# Patient Record
Sex: Female | Born: 1970
Health system: Southern US, Community
[De-identification: ages and names within clinical notes are randomized; demographics above are authoritative.]

## PROBLEM LIST (undated history)

## (undated) DIAGNOSIS — T7840XA Allergy, unspecified, initial encounter: Secondary | ICD-10-CM

## (undated) DIAGNOSIS — N200 Calculus of kidney: Secondary | ICD-10-CM

## (undated) DIAGNOSIS — O009 Unspecified ectopic pregnancy without intrauterine pregnancy: Secondary | ICD-10-CM

## (undated) DIAGNOSIS — Z87442 Personal history of urinary calculi: Secondary | ICD-10-CM

## (undated) DIAGNOSIS — R011 Cardiac murmur, unspecified: Secondary | ICD-10-CM

## (undated) DIAGNOSIS — I10 Essential (primary) hypertension: Secondary | ICD-10-CM

## (undated) DIAGNOSIS — D649 Anemia, unspecified: Secondary | ICD-10-CM

## (undated) HISTORY — PX: ECTOPIC PREGNANCY SURGERY: SHX613

## (undated) HISTORY — PX: WRIST SURGERY: SHX841

## (undated) HISTORY — DX: Cardiac murmur, unspecified: R01.1

## (undated) HISTORY — DX: Essential (primary) hypertension: I10

## (undated) HISTORY — PX: LITHOTRIPSY: SUR834

## (undated) HISTORY — PX: COLONOSCOPY: SHX174

## (undated) HISTORY — DX: Allergy, unspecified, initial encounter: T78.40XA

---

## 1998-05-26 ENCOUNTER — Other Ambulatory Visit: Admission: RE | Admit: 1998-05-26 | Discharge: 1998-05-26 | Payer: Self-pay | Admitting: Obstetrics and Gynecology

## 1999-05-22 ENCOUNTER — Other Ambulatory Visit: Admission: RE | Admit: 1999-05-22 | Discharge: 1999-05-22 | Payer: Self-pay | Admitting: Obstetrics and Gynecology

## 2000-09-09 ENCOUNTER — Inpatient Hospital Stay (HOSPITAL_COMMUNITY): Admission: AD | Admit: 2000-09-09 | Discharge: 2000-09-11 | Payer: Self-pay | Admitting: *Deleted

## 2000-09-13 ENCOUNTER — Inpatient Hospital Stay (HOSPITAL_COMMUNITY): Admission: AD | Admit: 2000-09-13 | Discharge: 2000-09-18 | Payer: Self-pay | Admitting: Obstetrics & Gynecology

## 2000-10-11 ENCOUNTER — Other Ambulatory Visit: Admission: RE | Admit: 2000-10-11 | Discharge: 2000-10-11 | Payer: Self-pay | Admitting: Obstetrics & Gynecology

## 2002-07-02 ENCOUNTER — Other Ambulatory Visit: Admission: RE | Admit: 2002-07-02 | Discharge: 2002-07-02 | Payer: Self-pay | Admitting: Obstetrics and Gynecology

## 2002-12-17 DIAGNOSIS — O009 Unspecified ectopic pregnancy without intrauterine pregnancy: Secondary | ICD-10-CM

## 2002-12-17 HISTORY — DX: Unspecified ectopic pregnancy without intrauterine pregnancy: O00.90

## 2003-07-13 ENCOUNTER — Other Ambulatory Visit: Admission: RE | Admit: 2003-07-13 | Discharge: 2003-07-13 | Payer: Self-pay | Admitting: Obstetrics and Gynecology

## 2003-09-06 ENCOUNTER — Emergency Department (HOSPITAL_COMMUNITY): Admission: EM | Admit: 2003-09-06 | Discharge: 2003-09-07 | Payer: Self-pay | Admitting: Emergency Medicine

## 2003-09-07 ENCOUNTER — Encounter: Payer: Self-pay | Admitting: Emergency Medicine

## 2003-09-15 ENCOUNTER — Encounter: Payer: Self-pay | Admitting: Emergency Medicine

## 2003-09-16 ENCOUNTER — Ambulatory Visit (HOSPITAL_COMMUNITY): Admission: AD | Admit: 2003-09-16 | Discharge: 2003-09-16 | Payer: Self-pay | Admitting: Obstetrics & Gynecology

## 2004-07-13 ENCOUNTER — Other Ambulatory Visit: Admission: RE | Admit: 2004-07-13 | Discharge: 2004-07-13 | Payer: Self-pay | Admitting: Obstetrics and Gynecology

## 2005-12-10 ENCOUNTER — Inpatient Hospital Stay (HOSPITAL_COMMUNITY): Admission: AD | Admit: 2005-12-10 | Discharge: 2005-12-19 | Payer: Self-pay | Admitting: Obstetrics & Gynecology

## 2005-12-12 ENCOUNTER — Ambulatory Visit: Payer: Self-pay | Admitting: Internal Medicine

## 2005-12-14 ENCOUNTER — Encounter: Payer: Self-pay | Admitting: Urology

## 2005-12-18 ENCOUNTER — Ambulatory Visit: Payer: Self-pay | Admitting: Pulmonary Disease

## 2006-01-08 ENCOUNTER — Other Ambulatory Visit: Admission: RE | Admit: 2006-01-08 | Discharge: 2006-01-08 | Payer: Self-pay | Admitting: Obstetrics and Gynecology

## 2006-02-11 ENCOUNTER — Ambulatory Visit (HOSPITAL_COMMUNITY): Admission: RE | Admit: 2006-02-11 | Discharge: 2006-02-11 | Payer: Self-pay | Admitting: Urology

## 2011-06-21 ENCOUNTER — Ambulatory Visit (HOSPITAL_BASED_OUTPATIENT_CLINIC_OR_DEPARTMENT_OTHER)
Admission: RE | Admit: 2011-06-21 | Discharge: 2011-06-21 | Disposition: A | Discharge status: Home / Self Care | Payer: BC Managed Care – PPO | Source: Ambulatory Visit | Attending: Orthopedic Surgery | Admitting: Orthopedic Surgery

## 2011-06-21 DIAGNOSIS — M6740 Ganglion, unspecified site: Secondary | ICD-10-CM | POA: Insufficient documentation

## 2011-06-21 DIAGNOSIS — Z01812 Encounter for preprocedural laboratory examination: Secondary | ICD-10-CM | POA: Insufficient documentation

## 2011-06-21 LAB — POCT HEMOGLOBIN-HEMACUE: Hemoglobin: 12.4 g/dL (ref 12.0–15.0)

## 2011-06-29 NOTE — Op Note (Signed)
Beth Mejia, Beth Mejia               ACCOUNT NO.:  [id number]  MEDICAL RECORD NO.:  [id number]  LOCATION:                                 FACILITY:  PHYSICIAN:  Beth Fitch. Shaneal Barasch, M.D.      DATE OF BIRTH:  DATE OF PROCEDURE:  06/21/2011 DATE OF DISCHARGE:                              OPERATIVE REPORT   PREOPERATIVE DIAGNOSES:  Right volar ganglion cyst, 12 years status post prior resection of myxoid cyst from similar location with identification of myxoid cyst involving arterial branches of scaphoid and bifurcation of superficial and dorsal branches of radial artery.  POSTOPERATIVE DIAGNOSES:  Right volar ganglion cyst, 12 years status post prior resection of myxoid cyst from similar location with identification of myxoid cyst involving arterial branches of scaphoid and bifurcation of superficial and dorsal branches of radial artery.  OPERATION:  Resection of myxoid cyst, bifurcation of dorsal branch and superficial branch of right radial artery with involvement of branch to scaphoid.  OPERATING SURGEON:  Beth Fitch. Beth Ries, MD  ASSISTANT:  Beth Reeks Dasnoit, PA-C  ANESTHESIA:  General by LMA.  SUPERVISING ANESTHESIOLOGIST:  Beth Hora. 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 Twelve years prior, we had resected a cyst from a similar location.  She had a prolonged period of no recurrence.  Within the past 2 months, she developed a marble-sized firm mass at the bifurcation of the radial artery that was uncomfortable for her.  She requested this to be reexcised.  We explained her the natural history of cyst.  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  Under these circumstances, her goal was to have pain relief, therefore she requested to attempt a repeat resection.  Preoperatively, she was  advised of the other risks and benefits of surgery including anesthesia risks, risks of infection, and possible injury to the arterial structures.  Questions are invited and answered in detail.  PROCEDURE:  Beth Mejia was brought to room #2 of the [facility] and placed in supine position upon the operating table. Following an anesthesia consult with Dr. Gelene Mejia, general anesthesia by LMA technique was recommended and accepted.  Under Beth Mejia strict supervision, general anesthesia was induced with LMA technique followed by routine Betadine scrub and paint of the right upper extremity.  Following routine surgical time-out, the right arm was exsanguinated with Esmarch bandage and arterial tourniquet was inflated to 220 mmHg. Procedure commenced with resection of the prior transverse surgical scar.  Subcutaneous tissues were carefully divided taking care to identify the radial artery and its accompanying veins proximal to the site of the cyst.  With meticulous dissection, the cyst was dissected off the superficial and dorsal branch of the radial artery.  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  After circumferential dissection, the cyst was drained of its contents and the entire membrane removed.  The radial artery was then distended by direct pressure over  the proximal and distal segments of the artery. There was no sign of leakage.  The joint was carefully inspected.  No other sites of myxoid cyst formation was identified.  The volar radiocarpal ligaments were intact. Bleeding points were cauterized with bipolar current followed by repair of the skin with subcutaneous 4-0 Vicryl and intradermal 3-0 Prolene with Steri-Strips.  Compressive dressing was applied with a volar plaster splint maintaining the wrist in 10 degrees of dorsiflexion.  For  aftercare, Beth Mejia was provided prescription for Vicodin 5 mg 1 p.o. q.4-6 h. p.r.n. pain, 20 tablets without refill.  We will see her back for followup in a week for suture removal and discussion of postoperative rehab program.     Beth Fitch. Shedrick Sarli, M.D.     RVS/MEDQ  D:  06/21/2011  T:  06/22/2011  Job:  [id number]  cc:   Beth Mejia, M.D.  Electronically Signed by Beth Mejia M.D. on 06/29/2011 08:39:49 AM

## 2013-05-18 ENCOUNTER — Other Ambulatory Visit: Payer: Self-pay

## 2017-05-16 DIAGNOSIS — N200 Calculus of kidney: Secondary | ICD-10-CM | POA: Insufficient documentation

## 2017-05-16 DIAGNOSIS — R1031 Right lower quadrant pain: Secondary | ICD-10-CM | POA: Diagnosis not present

## 2017-05-16 DIAGNOSIS — N201 Calculus of ureter: Secondary | ICD-10-CM | POA: Insufficient documentation

## 2017-05-17 ENCOUNTER — Encounter (HOSPITAL_COMMUNITY): Payer: Self-pay

## 2017-05-17 ENCOUNTER — Other Ambulatory Visit: Payer: Self-pay | Admitting: Urology

## 2017-05-17 ENCOUNTER — Encounter (HOSPITAL_COMMUNITY): Payer: Self-pay | Admitting: *Deleted

## 2017-05-17 ENCOUNTER — Emergency Department (HOSPITAL_COMMUNITY): Payer: BLUE CROSS/BLUE SHIELD

## 2017-05-17 ENCOUNTER — Emergency Department (HOSPITAL_COMMUNITY)
Admission: EM | Admit: 2017-05-17 | Discharge: 2017-05-17 | Disposition: A | Discharge status: Home / Self Care | Payer: BLUE CROSS/BLUE SHIELD | Attending: Emergency Medicine | Admitting: Emergency Medicine

## 2017-05-17 DIAGNOSIS — N201 Calculus of ureter: Secondary | ICD-10-CM

## 2017-05-17 DIAGNOSIS — N23 Unspecified renal colic: Secondary | ICD-10-CM

## 2017-05-17 DIAGNOSIS — R109 Unspecified abdominal pain: Secondary | ICD-10-CM

## 2017-05-17 DIAGNOSIS — N200 Calculus of kidney: Secondary | ICD-10-CM

## 2017-05-17 HISTORY — DX: Calculus of kidney: N20.0

## 2017-05-17 HISTORY — DX: Unspecified ectopic pregnancy without intrauterine pregnancy: O00.90

## 2017-05-17 LAB — URINALYSIS, ROUTINE W REFLEX MICROSCOPIC
Bacteria, UA: NONE SEEN
Bilirubin Urine: NEGATIVE
GLUCOSE, UA: NEGATIVE mg/dL
Ketones, ur: NEGATIVE mg/dL
Leukocytes, UA: NEGATIVE
NITRITE: NEGATIVE
PROTEIN: NEGATIVE mg/dL
Specific Gravity, Urine: 1.013 (ref 1.005–1.030)
pH: 7 (ref 5.0–8.0)

## 2017-05-17 LAB — COMPREHENSIVE METABOLIC PANEL
ALK PHOS: 55 U/L (ref 38–126)
ALT: 16 U/L (ref 14–54)
ANION GAP: 6 (ref 5–15)
AST: 19 U/L (ref 15–41)
Albumin: 4 g/dL (ref 3.5–5.0)
BUN: 14 mg/dL (ref 6–20)
CALCIUM: 9.1 mg/dL (ref 8.9–10.3)
CO2: 24 mmol/L (ref 22–32)
Chloride: 106 mmol/L (ref 101–111)
Creatinine, Ser: 1 mg/dL (ref 0.44–1.00)
GFR calc Af Amer: >60 mL/min (ref >60)
GLUCOSE: 126 mg/dL — AB (ref 65–99)
Potassium: 3.7 mmol/L (ref 3.5–5.1)
Sodium: 136 mmol/L (ref 135–145)
TOTAL PROTEIN: 7.2 g/dL (ref 6.5–8.1)
Total Bilirubin: 0.7 mg/dL (ref 0.3–1.2)

## 2017-05-17 LAB — CBC
HCT: 37.8 % (ref 36.0–46.0)
HEMOGLOBIN: 12.7 g/dL (ref 12.0–15.0)
MCH: 28.7 pg (ref 26.0–34.0)
MCHC: 33.6 g/dL (ref 30.0–36.0)
MCV: 85.3 fL (ref 78.0–100.0)
Platelets: 250 10*3/uL (ref 150–400)
RBC: 4.43 MIL/uL (ref 3.87–5.11)
RDW: 13.7 % (ref 11.5–15.5)
WBC: 9.7 10*3/uL (ref 4.0–10.5)

## 2017-05-17 LAB — POC URINE PREG, ED: Preg Test, Ur: NEGATIVE

## 2017-05-17 LAB — LIPASE, BLOOD: Lipase: 25 U/L (ref 11–51)

## 2017-05-17 MED ORDER — ONDANSETRON HCL 4 MG/2ML IJ SOLN
4.0000 mg | Freq: Once | INTRAMUSCULAR | Status: AC
Start: 2017-05-17 — End: 2017-05-17
  Administered 2017-05-17: 4 mg via INTRAVENOUS
  Filled 2017-05-17: qty 2

## 2017-05-17 MED ORDER — ONDANSETRON 4 MG PO TBDP
4.0000 mg | ORAL_TABLET | Freq: Once | ORAL | Status: AC | PRN
  Administered 2017-05-17: 4 mg via ORAL
  Filled 2017-05-17: qty 1

## 2017-05-17 MED ORDER — TAMSULOSIN HCL 0.4 MG PO CAPS: 0.4000 mg | ORAL_CAPSULE | Freq: Every day | ORAL | Status: DC

## 2017-05-17 MED ORDER — OXYCODONE-ACETAMINOPHEN 5-325 MG PO TABS: 1.0000 | ORAL_TABLET | ORAL | 0 refills | Status: DC | PRN

## 2017-05-17 MED ORDER — MORPHINE SULFATE (PF) 2 MG/ML IV SOLN
4.0000 mg | Freq: Once | INTRAVENOUS | Status: AC
  Administered 2017-05-17: 4 mg via INTRAVENOUS
  Filled 2017-05-17: qty 2

## 2017-05-17 MED ORDER — ONDANSETRON 8 MG PO TBDP: 8.0000 mg | ORAL_TABLET | Freq: Three times a day (TID) | ORAL | 0 refills | Status: DC | PRN

## 2017-05-17 MED ORDER — SODIUM CHLORIDE 0.9 % IV BOLUS (SEPSIS)
1000.0000 mL | Freq: Once | INTRAVENOUS | Status: AC
  Administered 2017-05-17: 1000 mL via INTRAVENOUS

## 2017-05-17 MED ORDER — KETOROLAC TROMETHAMINE 30 MG/ML IJ SOLN
30.0000 mg | Freq: Once | INTRAMUSCULAR | Status: AC
  Administered 2017-05-17: 30 mg via INTRAVENOUS
  Filled 2017-05-17: qty 1

## 2017-05-17 NOTE — ED Provider Notes (Signed)
[facility] DEPT Provider Note   CSN: [id number] Arrival date & time: 05/16/17  2335  By signing my name below, I, Ny'Kea Lewis, attest that this documentation has been prepared under the direction and in the presence of Delora Fuel, MD. Electronically Signed: Lise Auer, ED Scribe. 05/17/17. 3:19 AM.  History   Chief Complaint Chief Complaint  Patient presents with  . Flank Pain    Right  . Vaginal Bleeding   HPI  HPI Comments Beth Mejia is a 46 y.o. female who presents to the Emergency Department complaining of sudden onset, intermeittent right flank pain that started at 2:30pm yesterday but has become consistent the last four hours, she rates the severity of her pain a 10. She notes associated nausea, vomiting, constipation, vaginal bleeding, and right lower quadrant abdominal pain that radiates into her back. Pt also reports pain in her right ovary which she describes as "feels like it is being squeezed". Her menstrual cycle just started today. She notes a hx of kidney stones. Denies dysuria or any other acute associated symptoms at this time.   Past Medical History:  Diagnosis Date  . Ectopic pregnancy 2004  . Kidney stone     There are no active problems to display for this patient.   Past Surgical History:  Procedure Laterality Date  . CESAREAN SECTION      OB History    No data available       Home Medications    Prior to Admission medications   Not on File    Family History History reviewed. No pertinent family history.  Social History Social History  Substance Use Topics  . Smoking status: Never Smoker  . Smokeless tobacco: Never Used  . Alcohol use No    Allergies   Aspirin  Review of Systems Review of Systems  Gastrointestinal: Positive for abdominal pain, constipation, nausea and vomiting.  Genitourinary: Negative for dysuria.  Musculoskeletal: Positive for back pain.    Physical Exam Updated Vital Signs BP (!) 157/95 (BP  Location: Left Arm)   Pulse 75   Temp 98.2 F (36.8 C) (Oral)   Resp 18   LMP 05/17/2017   SpO2 100%   Physical Exam  Constitutional: She is oriented to person, place, and time. She appears well-developed and well-nourished.  Appears uncomfortable.   HENT:  Head: Normocephalic and atraumatic.  Eyes: EOM are normal. Pupils are equal, round, and reactive to light.  Neck: Normal range of motion. Neck supple. No JVD present.  Cardiovascular: Normal rate, regular rhythm and normal heart sounds.   No murmur heard. Pulmonary/Chest: Effort normal and breath sounds normal. She has no wheezes. She has no rales. She exhibits no tenderness.  Abdominal: Soft. She exhibits no distension and no mass. There is tenderness.  Bowel sounds decreased. Moderate right CVA tenderness.   Musculoskeletal: Normal range of motion. She exhibits no edema.  Lymphadenopathy:    She has no cervical adenopathy.  Neurological: She is alert and oriented to person, place, and time. No cranial nerve deficit. She exhibits normal muscle tone. Coordination normal.  Skin: Skin is warm and dry. No rash noted.  Psychiatric: She has a normal mood and affect. Her behavior is normal. Judgment and thought content normal.  Nursing note and vitals reviewed.   ED Treatments / Results  DIAGNOSTIC STUDIES: Oxygen Saturation is 100% on RA, normal by my interpretation.   COORDINATION OF CARE: 2:19 AM-Discussed next steps with pt. Pt verbalized understanding and is agreeable with the  plan.   Labs (all labs ordered are listed, but only abnormal results are displayed) Labs Reviewed  COMPREHENSIVE METABOLIC PANEL - Abnormal; Notable for the following:       Result Value   Glucose, Bld 126 (*)    All other components within normal limits  URINALYSIS, ROUTINE W REFLEX MICROSCOPIC - Abnormal; Notable for the following:    Hgb urine dipstick MODERATE (*)    Squamous Epithelial / LPF 0-5 (*)    All other components within normal  limits  LIPASE, BLOOD  CBC  POC URINE PREG, ED    Radiology Ct Renal Stone Study  Result Date: 05/17/2017 CLINICAL DATA:  Acute onset of right flank pain, nausea and vomiting. Constipation. Initial encounter. EXAM: CT ABDOMEN AND PELVIS WITHOUT CONTRAST TECHNIQUE: Multidetector CT imaging of the abdomen and pelvis was performed following the standard protocol without IV contrast. COMPARISON:  CT of the abdomen and pelvis from 01/30/2006 FINDINGS: Lower chest: The visualized lung bases are grossly clear. The visualized portions of the mediastinum are unremarkable. Hepatobiliary: A calcified granuloma is noted at the right hepatic lobe. The liver is otherwise unremarkable. The gallbladder is unremarkable in appearance. The common bile duct remains normal in caliber. Pancreas: The pancreas is within normal limits. Spleen: The spleen is unremarkable in appearance. Adrenals/Urinary Tract: The adrenal glands are unremarkable in appearance. There is moderate to severe right-sided hydronephrosis, with right-sided perinephric stranding. An obstructing 9 x 7 mm stone is noted at the right ureteropelvic junction. A nonobstructing 4 mm stone is noted at the interpole region of the left kidney. Stomach/Bowel: The stomach is unremarkable in appearance. The small bowel is within normal limits. The appendix is normal in caliber, without evidence of appendicitis. Scattered diverticulosis noted at the mid descending colon. The colon is otherwise unremarkable. Vascular/Lymphatic: The abdominal aorta is unremarkable in appearance. The inferior vena cava is grossly unremarkable. No retroperitoneal lymphadenopathy is seen. No pelvic sidewall lymphadenopathy is identified. Reproductive: The bladder is mildly distended and grossly remarkable. The uterus is grossly unremarkable in appearance. The ovaries are relatively symmetric. No suspicious adnexal masses are seen. Other: No additional soft tissue abnormalities are seen.  Musculoskeletal: No acute osseous abnormalities are identified. The visualized musculature is unremarkable in appearance. IMPRESSION: 1. Moderate to severe right-sided hydronephrosis, with an obstructing large 9 x 7 mm stone noted proximally at the right ureteropelvic junction. 2. Nonobstructing 4 mm stone at the interpole region of the left kidney. 3. Scattered diverticulosis at the mid descending colon, without evidence of diverticulitis. Electronically Signed   By: Garald Balding M.D.   On: 05/17/2017 02:59    Procedures Procedures (including critical care time)  Medications Ordered in ED Medications  morphine 2 MG/ML injection 4 mg (not administered)  ondansetron (ZOFRAN-ODT) disintegrating tablet 4 mg (4 mg Oral Given 05/17/17 0036)  sodium chloride 0.9 % bolus 1,000 mL (1,000 mLs Intravenous New Bag/Given 05/17/17 0233)  ondansetron (ZOFRAN) injection 4 mg (4 mg Intravenous Given 05/17/17 0234)  ketorolac (TORADOL) 30 MG/ML injection 30 mg (30 mg Intravenous Given 05/17/17 0240)  morphine 2 MG/ML injection 4 mg (4 mg Intravenous Given 05/17/17 0240)     Initial Impression / Assessment and Plan / ED Course  I have reviewed the triage vital signs and the nursing notes.  Pertinent labs & imaging results that were available during my care of the patient were reviewed by me and considered in my medical decision making (see chart for details).  Flank pain radiating to lower quadrant  suspicious for renal colic. Consider pancreatitis, diverticulitis, ovarian cyst. She is given IV fluids and IV ketorolac and morphine and ondansetron with good relief of symptoms. Old records are reviewed, and she did have a kidney stone in 2004. Laboratory workup is unremarkable. CT of abdomen and pelvis does show a 9 mm right ureteral calculus at the ureteropelvic junction. This is unlikely to pass spontaneously. She has seen a urologist in the past. She is referred back to him for consideration for lithotripsy. She is  discharged with prescriptions for tamsulosin, oxycodone-acetaminophen, and ondansetron.  Final Clinical Impressions(s) / ED Diagnoses   Final diagnoses:  Right flank pain  Ureterolithiasis  Ureteral colic  Nephrolithiasis    New Prescriptions New Prescriptions   ONDANSETRON (ZOFRAN-ODT) 8 MG DISINTEGRATING TABLET    Take 1 tablet (8 mg total) by mouth every 8 (eight) hours as needed for nausea or vomiting.   OXYCODONE-ACETAMINOPHEN (PERCOCET) 5-325 MG TABLET    Take 1 tablet by mouth every 4 (four) hours as needed for moderate pain.   TAMSULOSIN (FLOMAX) 0.4 MG CAPS CAPSULE    Take 1 capsule (0.4 mg total) by mouth daily.   \I personally performed the services described in this documentation, which was scribed in my presence. The recorded information has been reviewed and is accurate.       Delora Fuel, MD 36/62/94 [phone]

## 2017-05-17 NOTE — Discharge Instructions (Signed)
Do not take any ibuprofen or naproxen. Call your urologist to see if you can set up lithotripsy next week. Return if pain is not being controlled, or if you start running a fever.

## 2017-05-17 NOTE — ED Notes (Signed)
Pt changed mind about pain med's: morphine 4mg  given

## 2017-05-17 NOTE — ED Triage Notes (Signed)
Pt reports 10/10 right flank pain, right ovary pain, and vaginal bleeding that started x2 hrs ago. Pt reports hx of kidney stones.

## 2017-05-20 ENCOUNTER — Encounter (HOSPITAL_COMMUNITY): Payer: Self-pay | Admitting: *Deleted

## 2017-05-20 ENCOUNTER — Encounter (HOSPITAL_COMMUNITY)
Admission: RE | Disposition: A | Discharge status: Home / Self Care | Payer: Self-pay | Source: Ambulatory Visit | Attending: Urology

## 2017-05-20 ENCOUNTER — Ambulatory Visit (HOSPITAL_COMMUNITY): Payer: BLUE CROSS/BLUE SHIELD

## 2017-05-20 ENCOUNTER — Ambulatory Visit (HOSPITAL_COMMUNITY)
Admission: RE | Admit: 2017-05-20 | Discharge: 2017-05-20 | Disposition: A | Discharge status: Home / Self Care | Payer: BLUE CROSS/BLUE SHIELD | Source: Ambulatory Visit | Attending: Urology | Admitting: Urology

## 2017-05-20 DIAGNOSIS — Z87442 Personal history of urinary calculi: Secondary | ICD-10-CM | POA: Diagnosis not present

## 2017-05-20 DIAGNOSIS — N201 Calculus of ureter: Secondary | ICD-10-CM | POA: Diagnosis not present

## 2017-05-20 DIAGNOSIS — D649 Anemia, unspecified: Secondary | ICD-10-CM | POA: Diagnosis not present

## 2017-05-20 HISTORY — DX: Anemia, unspecified: D64.9

## 2017-05-20 HISTORY — DX: Personal history of urinary calculi: Z87.442

## 2017-05-20 HISTORY — PX: EXTRACORPOREAL SHOCK WAVE LITHOTRIPSY: SHX1557

## 2017-05-20 SURGERY — LITHOTRIPSY, ESWL
Anesthesia: LOCAL | Laterality: Right

## 2017-05-20 MED ORDER — DIPHENHYDRAMINE HCL 25 MG PO CAPS
25.0000 mg | ORAL_CAPSULE | ORAL | Status: AC
  Administered 2017-05-20: 25 mg via ORAL
  Filled 2017-05-20: qty 1

## 2017-05-20 MED ORDER — DIAZEPAM 5 MG PO TABS
10.0000 mg | ORAL_TABLET | ORAL | Status: AC
  Administered 2017-05-20: 10 mg via ORAL
  Filled 2017-05-20: qty 2

## 2017-05-20 MED ORDER — OXYCODONE-ACETAMINOPHEN 5-325 MG PO TABS: 1.0000 | ORAL_TABLET | ORAL | 0 refills | Status: DC | PRN

## 2017-05-20 MED ORDER — SODIUM CHLORIDE 0.9 % IV SOLN
INTRAVENOUS | Status: DC
  Administered 2017-05-20: 11:00:00 via INTRAVENOUS

## 2017-05-20 MED ORDER — CIPROFLOXACIN HCL 500 MG PO TABS
500.0000 mg | ORAL_TABLET | ORAL | Status: AC
  Administered 2017-05-20: 500 mg via ORAL
  Filled 2017-05-20: qty 1

## 2017-05-20 MED ORDER — HYDROMORPHONE HCL 1 MG/ML IJ SOLN: 0.5000 mg | Freq: Once | INTRAMUSCULAR | Status: DC

## 2017-05-20 MED ORDER — OXYCODONE-ACETAMINOPHEN 5-325 MG PO TABS
1.0000 | ORAL_TABLET | Freq: Once | ORAL | Status: AC
  Administered 2017-05-20: 2 via ORAL
  Filled 2017-05-20: qty 2

## 2017-05-20 MED ORDER — SENNOSIDES-DOCUSATE SODIUM 8.6-50 MG PO TABS: 1.0000 | ORAL_TABLET | Freq: Two times a day (BID) | ORAL | 0 refills | Status: DC

## 2017-05-20 NOTE — H&P (Signed)
Beth Mejia is an 46 y.o. female.    Chief Complaint: Pre-op RIGHT Shockwave Lithotripsy  HPI:   1 - RIGHT Ureteral Stone - 45mm Rt proximal stone (L3-4 interspace) by CT on eval colicky flank pain. NO fevers. Most recent UA without infectious parameters. She has elected shockwave lithotripsy in counel with Dr. Pilar Jarvis.   Past Medical History:  Diagnosis Date  . Anemia   . Ectopic pregnancy 2004  . History of kidney stones   . Kidney stone     Past Surgical History:  Procedure Laterality Date  . CESAREAN SECTION    . LITHOTRIPSY    . WRIST SURGERY     cyst removed per right wrist     History reviewed. No pertinent family history. Social History:  reports that she has never smoked. She has never used smokeless tobacco. She reports that she does not drink alcohol or use drugs.  Allergies:  Allergies  Allergen Reactions  . Aspirin Rash    No prescriptions prior to admission.    No results found for this or any previous visit (from the past 48 hour(s)). No results found.  Review of Systems  Constitutional: Negative.  Negative for chills and fever.  HENT: Negative.   Eyes: Negative.   Respiratory: Negative.   Cardiovascular: Negative.   Gastrointestinal: Negative.   Genitourinary: Positive for flank pain.  Skin: Negative.   Neurological: Negative.   Endo/Heme/Allergies: Negative.   Psychiatric/Behavioral: Negative.     Last menstrual period 05/16/2017. Physical Exam  Constitutional: She appears well-developed.  HENT:  Head: Normocephalic.  Eyes: Pupils are equal, round, and reactive to light.  Neck: Normal range of motion.  Cardiovascular: Normal rate.   Respiratory: Effort normal.  GI: Soft.  Genitourinary:  Genitourinary Comments: Mild Rt CVAT  Musculoskeletal: Normal range of motion.  Neurological: She is alert.  Skin: Skin is warm.  Psychiatric: She has a normal mood and affect.     Assessment/Plan  Proceed as planned with RIGHT shockwave  lithotripsy. Risks, benefits, alternatives, expected peri-op course discssed previously and reiterated today.   Alexis Frock, MD 05/20/2017, 6:40 AM

## 2017-05-20 NOTE — Brief Op Note (Signed)
05/20/2017  12:49 PM  PATIENT:  Daneshia M Meuth  46 y.o. female  PRE-OPERATIVE DIAGNOSIS:  RIGHT URETERAL STONE  POST-OPERATIVE DIAGNOSIS:  * No post-op diagnosis entered *  PROCEDURE:  Procedure(s): RIGHT EXTRACORPOREAL SHOCK WAVE LITHOTRIPSY (ESWL) (Right)  SURGEON:  Surgeon(s) and Role:    Alexis Frock, MD - Primary  PHYSICIAN ASSISTANT:   ASSISTANTS: none   ANESTHESIA:   MAC  EBL:  No intake/output data recorded.  BLOOD ADMINISTERED:none  DRAINS: none   LOCAL MEDICATIONS USED:  NONE  SPECIMEN:  No Specimen  DISPOSITION OF SPECIMEN:  N/A  COUNTS:  YES  TOURNIQUET:  * No tourniquets in log *  DICTATION: .Note written in paper chart  PLAN OF CARE: Discharge to home after PACU  PATIENT DISPOSITION:  PACU - hemodynamically stable.   Delay start of Pharmacological VTE agent (>24hrs) due to surgical blood loss or risk of bleeding: not applicable

## 2017-05-20 NOTE — Discharge Instructions (Signed)
Moderate Conscious Sedation, Adult, Care After °These instructions provide you with information about caring for yourself after your procedure. Your health care provider may also give you more specific instructions. Your treatment has been planned according to current medical practices, but problems sometimes occur. Call your health care provider if you have any problems or questions after your procedure. °What can I expect after the procedure? °After your procedure, it is common: °· To feel sleepy for several hours. °· To feel clumsy and have poor balance for several hours. °· To have poor judgment for several hours. °· To vomit if you eat too soon. ° °Follow these instructions at home: °For at least 24 hours after the procedure: ° °· Do not: °? Participate in activities where you could fall or become injured. °? Drive. °? Use heavy machinery. °? Drink alcohol. °? Take sleeping pills or medicines that cause drowsiness. °? Make important decisions or sign legal documents. °? Take care of children on your own. °· Rest. °Eating and drinking °· Follow the diet recommended by your health care provider. °· If you vomit: °? Drink water, juice, or soup when you can drink without vomiting. °? Make sure you have little or no nausea before eating solid foods. °General instructions °· Have a responsible adult stay with you until you are awake and alert. °· Take over-the-counter and prescription medicines only as told by your health care provider. °· If you smoke, do not smoke without supervision. °· Keep all follow-up visits as told by your health care provider. This is important. °Contact a health care provider if: °· You keep feeling nauseous or you keep vomiting. °· You feel light-headed. °· You develop a rash. °· You have a fever. °Get help right away if: °· You have trouble breathing. °This information is not intended to replace advice given to you by your health care provider. Make sure you discuss any questions you have  with your health care provider. °Document Released: 09/23/2013 Document Revised: 05/07/2016 Document Reviewed: 03/24/2016 °Elsevier Interactive Patient Education © 2018 [employer] °Lithotripsy, Care After °This sheet gives you information about how to care for yourself after your procedure. Your health care provider may also give you more specific instructions. If you have problems or questions, contact your health care provider. °What can I expect after the procedure? °After the procedure, it is common to have: °· Some blood in your urine. This should only last for a few days. °· Soreness in your back, sides, or upper abdomen for a few days. °· Blotches or bruises on your back where the pressure wave entered the skin. °· Pain, discomfort, or nausea when pieces (fragments) of the kidney stone move through the tube that carries urine from the kidney to the bladder (ureter). Stone fragments may pass soon after the procedure, but they may continue to pass for up to 4-8 weeks. °? If you have severe pain or nausea, contact your health care provider. This may be caused by a large stone that was not broken up, and this may mean that you need more treatment. °· Some pain or discomfort during urination. °· Some pain or discomfort in the lower abdomen or (in men) at the base of the penis. ° °Follow these instructions at home: °Medicines °· Take over-the-counter and prescription medicines only as told by your health care provider. °· If you were prescribed an antibiotic medicine, take it as told by your health care provider. Do not stop taking the antibiotic even if you   start to feel better.  Do not drive for 24 hours if you were given a medicine to help you relax (sedative).  Do not drive or use heavy machinery while taking prescription pain medicine. Eating and drinking  Drink enough water and fluids to keep your urine clear or pale yellow. This helps any remaining pieces of the stone to pass. It can also help  prevent new stones from forming.  Eat plenty of fresh fruits and vegetables.  Follow instructions from your health care provider about eating and drinking restrictions. You may be instructed: ? To reduce how much salt (sodium) you eat or drink. Check ingredients and nutrition facts on packaged foods and beverages. ? To reduce how much meat you eat.  Eat the recommended amount of calcium for your age and gender. Ask your health care provider how much calcium you should have. General instructions  Get plenty of rest.  Most people can resume normal activities 1-2 days after the procedure. Ask your health care provider what activities are safe for you.  If directed, strain all urine through the strainer that was provided by your health care provider. ? Keep all fragments for your health care provider to see. Any stones that are found may be sent to a medical lab for examination. The stone may be as small as a grain of salt.  Keep all follow-up visits as told by your health care provider. This is important. Contact a health care provider if:  You have pain that is severe or does not get better with medicine.  You have nausea that is severe or does not go away.  You have blood in your urine longer than your health care provider told you to expect.  You have more blood in your urine.  You have pain during urination that does not go away.  You urinate more frequently than usual and this does not go away.  You develop a rash or any other possible signs of an allergic reaction. Get help right away if:  You have severe pain in your back, sides, or upper abdomen.  You have severe pain while urinating.  Your urine is very dark red.  You have blood in your stool (feces).  You cannot pass any urine at all.  You feel a strong urge to urinate after emptying your bladder.  You have a fever or chills.  You develop shortness of breath, difficulty breathing, or chest pain.  You have  severe nausea that leads to persistent vomiting.  You faint. Summary  After this procedure, it is common to have some pain, discomfort, or nausea when pieces (fragments) of the kidney stone move through the tube that carries urine from the kidney to the bladder (ureter). If this pain or nausea is severe, however, you should contact your health care provider.  Most people can resume normal activities 1-2 days after the procedure. Ask your health care provider what activities are safe for you.  Drink enough water and fluids to keep your urine clear or pale yellow. This helps any remaining pieces of the stone to pass, and it can help prevent new stones from forming.  If directed, strain your urine and keep all fragments for your health care provider to see. Fragments or stones may be as small as a grain of salt.  Get help right away if you have severe pain in your back, sides, or upper abdomen or have severe pain while urinating. This information is not intended to replace advice  given to you by your health care provider. Make sure you discuss any questions you have with your health care provider. Document Released: 12/23/2007 Document Revised: 10/24/2016 Document Reviewed: 10/24/2016 Elsevier Interactive Patient Education  2017 [place] may have urinary urgency (bladder spasms), pass small stone fragments, and bloody urine on / off x several days. This is normal.  2 - Call MD or go to ER for fever >102, severe pain / nausea / vomiting not relieved by medications, or acute change in medical status

## 2017-05-21 ENCOUNTER — Encounter (HOSPITAL_COMMUNITY): Payer: Self-pay | Admitting: Urology

## 2017-10-25 DIAGNOSIS — T7840XA Allergy, unspecified, initial encounter: Secondary | ICD-10-CM | POA: Diagnosis not present

## 2017-11-04 DIAGNOSIS — Z01419 Encounter for gynecological examination (general) (routine) without abnormal findings: Secondary | ICD-10-CM | POA: Diagnosis not present

## 2017-11-04 DIAGNOSIS — Z6825 Body mass index (BMI) 25.0-25.9, adult: Secondary | ICD-10-CM | POA: Diagnosis not present

## 2017-11-04 DIAGNOSIS — Z124 Encounter for screening for malignant neoplasm of cervix: Secondary | ICD-10-CM | POA: Diagnosis not present

## 2017-11-15 DIAGNOSIS — Z6825 Body mass index (BMI) 25.0-25.9, adult: Secondary | ICD-10-CM | POA: Diagnosis not present

## 2017-11-15 DIAGNOSIS — Z1231 Encounter for screening mammogram for malignant neoplasm of breast: Secondary | ICD-10-CM | POA: Diagnosis not present

## 2017-12-15 DIAGNOSIS — J0100 Acute maxillary sinusitis, unspecified: Secondary | ICD-10-CM | POA: Diagnosis not present

## 2017-12-15 DIAGNOSIS — S335XXA Sprain of ligaments of lumbar spine, initial encounter: Secondary | ICD-10-CM | POA: Diagnosis not present

## 2018-01-22 DIAGNOSIS — J324 Chronic pansinusitis: Secondary | ICD-10-CM | POA: Diagnosis not present

## 2018-02-04 DIAGNOSIS — N200 Calculus of kidney: Secondary | ICD-10-CM | POA: Diagnosis not present

## 2018-04-23 IMAGING — CT CT RENAL STONE PROTOCOL
2 of 3 series · 16 of 46 positions shown, 18 images · non-contrast
Comparison: CT of the abdomen and pelvis from 01/30/2006

CLINICAL DATA: Acute onset of right flank pain, nausea and
vomiting. Constipation. Initial encounter.

EXAM:
CT ABDOMEN AND PELVIS WITHOUT CONTRAST
TECHNIQUE: Multidetector CT imaging of the abdomen and pelvis was performed
following the standard protocol without IV contrast.

[Series 3: coronal · coronal · 0.80mm/px · 3 of 116 slices shown]
[im 39/116  soft-tissue]
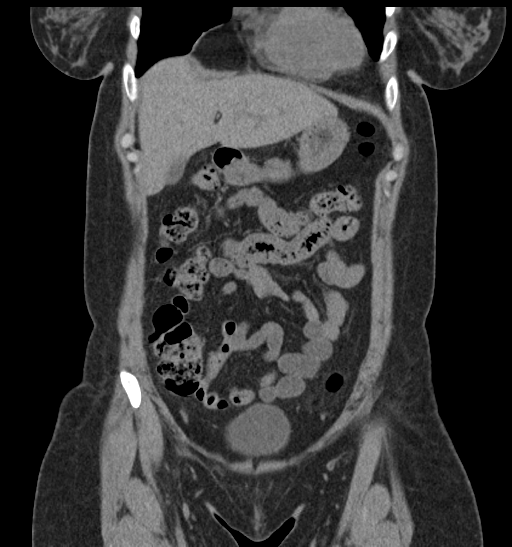
[im 52/116  soft-tissue]
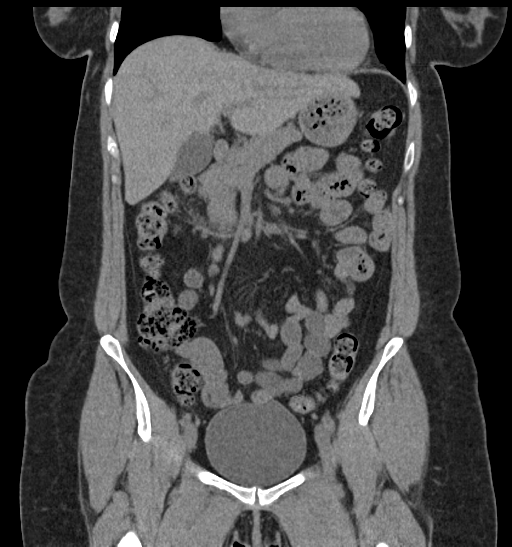
[im 64/116  soft-tissue]
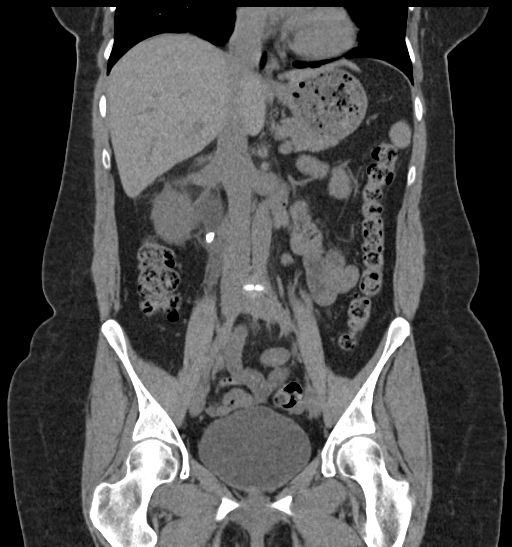

[Series 6: lung · axial · 0.65mm/px · z∈[-99,-17]mm · 13 of 47 slices shown, 15 images]
[im 3/47  soft-tissue]
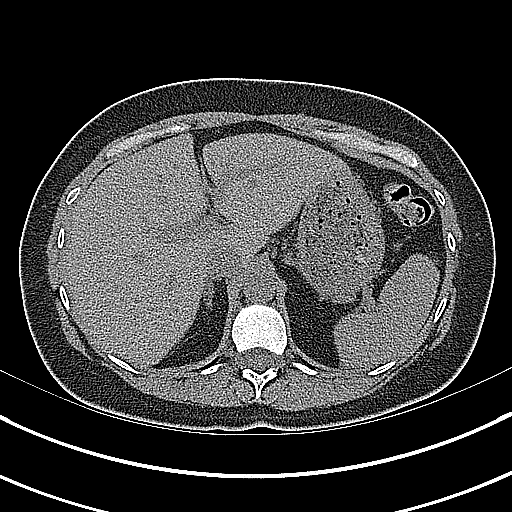
[im 3/47  bone]
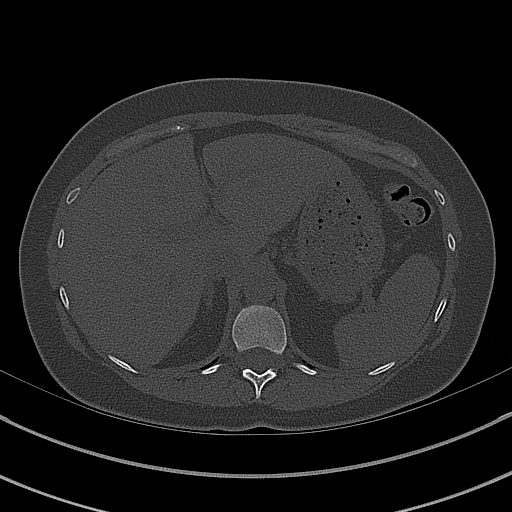
[im 6/47  soft-tissue]
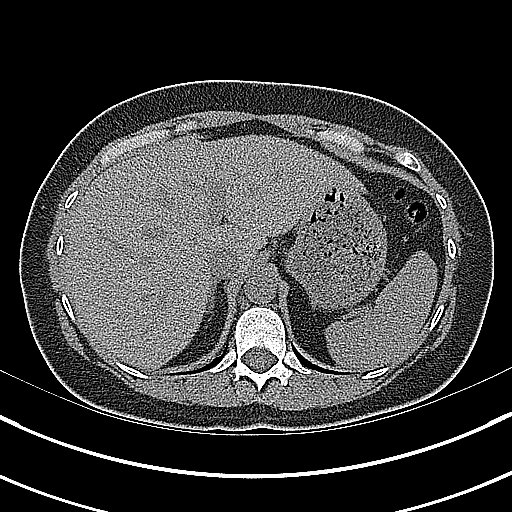
[im 9/47  soft-tissue]
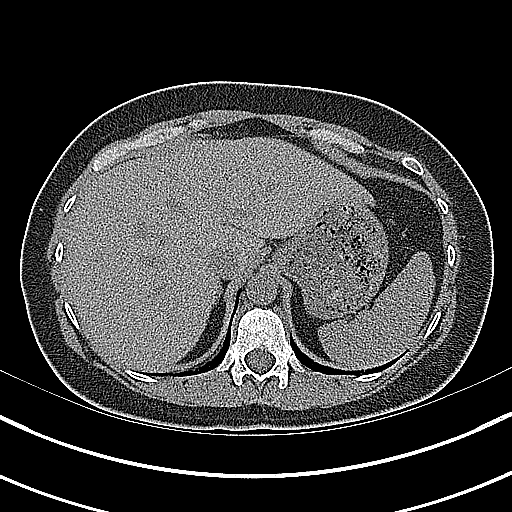
[im 14/47  soft-tissue]
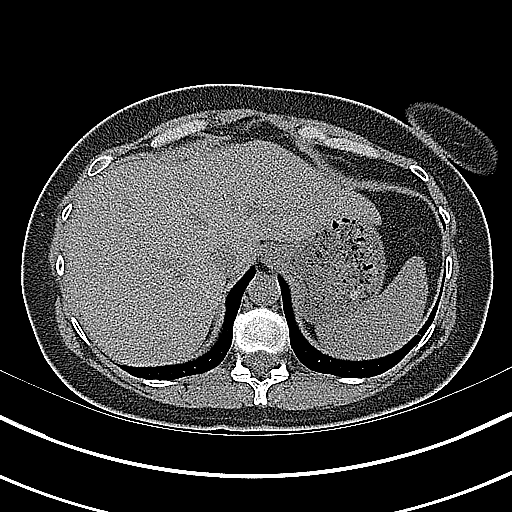
[im 17/47  soft-tissue]
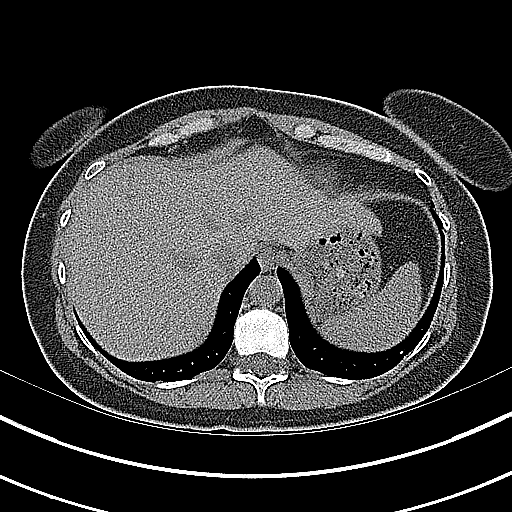
[im 20/47  soft-tissue]
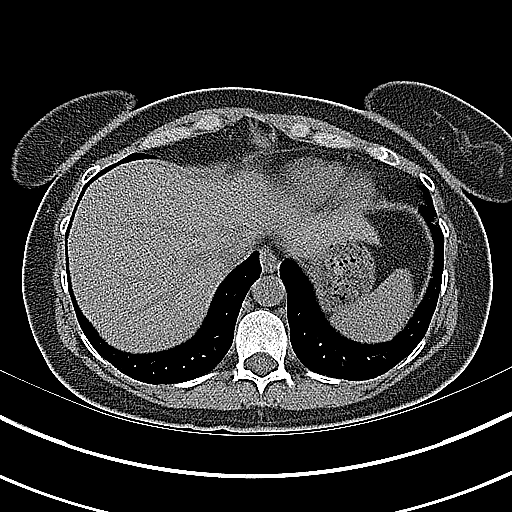
[im 24/47  soft-tissue]
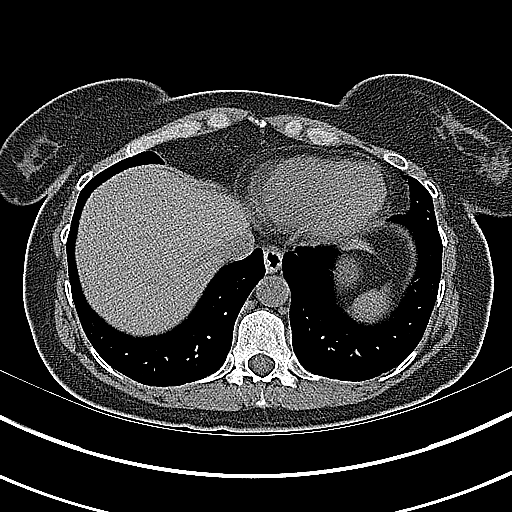
[im 27/47  soft-tissue]
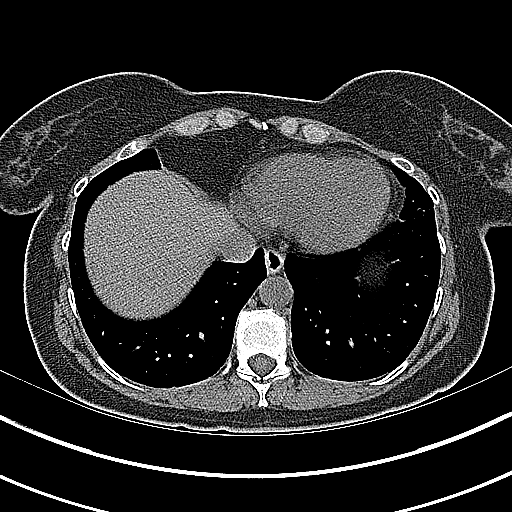
[im 30/47  soft-tissue]
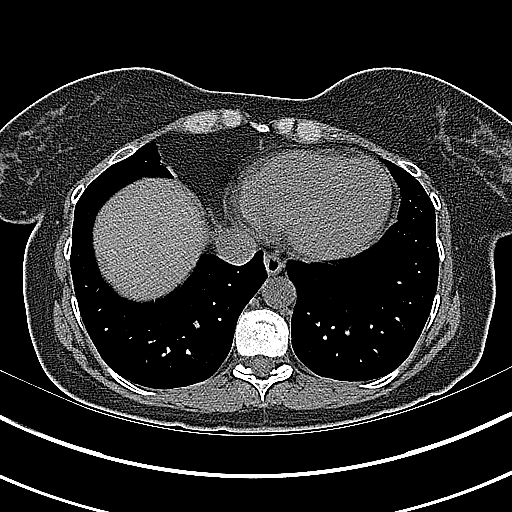
[im 30/47  bone]
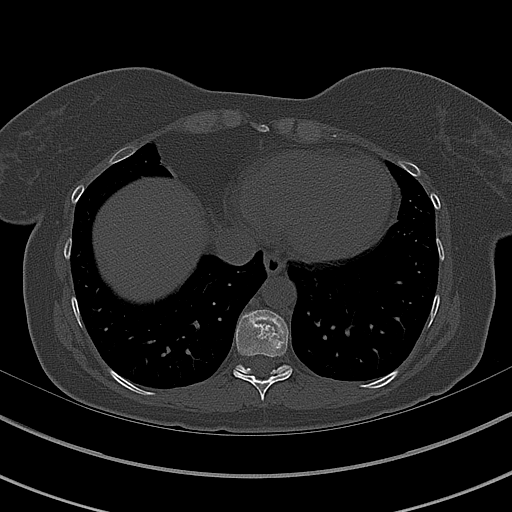
[im 33/47  soft-tissue]
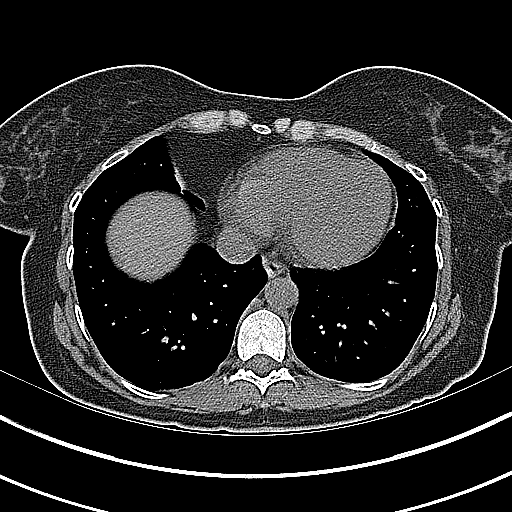
[im 38/47  soft-tissue]
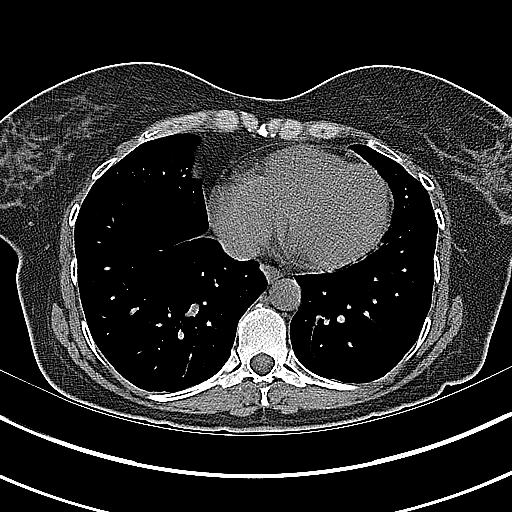
[im 41/47  soft-tissue]
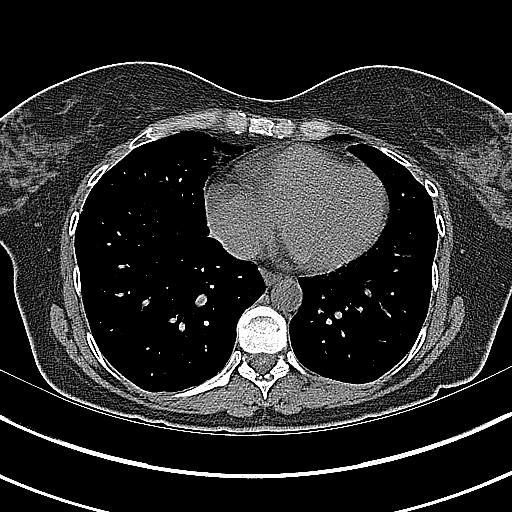
[im 44/47  soft-tissue]
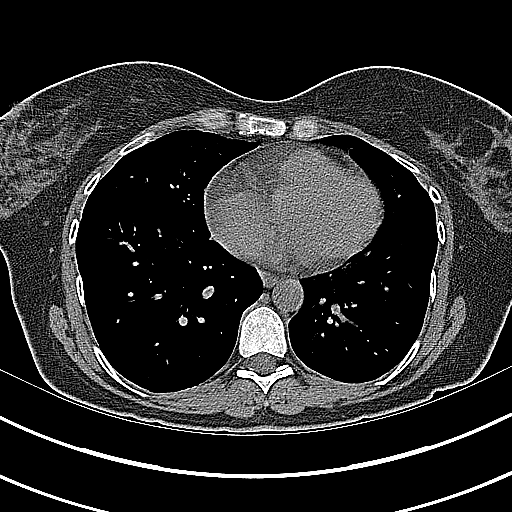

[16 of 46 positions shown; findings below may reference images not displayed]

FINDINGS: Lower chest: The visualized lung bases are grossly clear. The
visualized portions of the mediastinum are unremarkable.

Hepatobiliary: A calcified granuloma is noted at the right hepatic
lobe. The liver is otherwise unremarkable. The gallbladder is
unremarkable in appearance. The common bile duct remains normal in
caliber.

Pancreas: The pancreas is within normal limits.

Spleen: The spleen is unremarkable in appearance.

Adrenals/Urinary Tract: The adrenal glands are unremarkable in
appearance.

There is moderate to severe right-sided hydronephrosis, with
right-sided perinephric stranding. An obstructing 9 x 7 mm stone is
noted at the right ureteropelvic junction. A nonobstructing 4 mm
stone is noted at the interpole region of the left kidney.

Stomach/Bowel: The stomach is unremarkable in appearance. The small
bowel is within normal limits. The appendix is normal in caliber,
without evidence of appendicitis.

Scattered diverticulosis noted at the mid descending colon. The
colon is otherwise unremarkable.

Vascular/Lymphatic: The abdominal aorta is unremarkable in
appearance. The inferior vena cava is grossly unremarkable. No
retroperitoneal lymphadenopathy is seen. No pelvic sidewall
lymphadenopathy is identified.

Reproductive: The bladder is mildly distended and grossly
remarkable. The uterus is grossly unremarkable in appearance. The
ovaries are relatively symmetric. No suspicious adnexal masses are
seen.

Other: No additional soft tissue abnormalities are seen.

Musculoskeletal: No acute osseous abnormalities are identified. The
visualized musculature is unremarkable in appearance.
IMPRESSION: 1. Moderate to severe right-sided hydronephrosis, with an
obstructing large 9 x 7 mm stone noted proximally at the right
ureteropelvic junction.
2. Nonobstructing 4 mm stone at the interpole region of the left
kidney.
3. Scattered diverticulosis at the mid descending colon, without
evidence of diverticulitis.

## 2018-08-24 DIAGNOSIS — J019 Acute sinusitis, unspecified: Secondary | ICD-10-CM | POA: Diagnosis not present

## 2019-02-20 DIAGNOSIS — Z1231 Encounter for screening mammogram for malignant neoplasm of breast: Secondary | ICD-10-CM | POA: Diagnosis not present

## 2019-02-20 DIAGNOSIS — Z6825 Body mass index (BMI) 25.0-25.9, adult: Secondary | ICD-10-CM | POA: Diagnosis not present

## 2019-02-23 DIAGNOSIS — J209 Acute bronchitis, unspecified: Secondary | ICD-10-CM | POA: Diagnosis not present

## 2019-03-02 DIAGNOSIS — N393 Stress incontinence (female) (male): Secondary | ICD-10-CM | POA: Diagnosis not present

## 2019-03-02 DIAGNOSIS — N200 Calculus of kidney: Secondary | ICD-10-CM | POA: Diagnosis not present

## 2019-06-23 DIAGNOSIS — Z6825 Body mass index (BMI) 25.0-25.9, adult: Secondary | ICD-10-CM | POA: Diagnosis not present

## 2019-06-23 DIAGNOSIS — Z13220 Encounter for screening for lipoid disorders: Secondary | ICD-10-CM | POA: Diagnosis not present

## 2019-06-23 DIAGNOSIS — R899 Unspecified abnormal finding in specimens from other organs, systems and tissues: Secondary | ICD-10-CM | POA: Diagnosis not present

## 2019-06-23 DIAGNOSIS — Z79899 Other long term (current) drug therapy: Secondary | ICD-10-CM | POA: Diagnosis not present

## 2019-06-23 DIAGNOSIS — R635 Abnormal weight gain: Secondary | ICD-10-CM | POA: Diagnosis not present

## 2019-06-23 DIAGNOSIS — J302 Other seasonal allergic rhinitis: Secondary | ICD-10-CM | POA: Diagnosis not present

## 2019-07-14 ENCOUNTER — Telehealth: Payer: Self-pay | Admitting: Internal Medicine

## 2019-07-14 NOTE — Telephone Encounter (Signed)
Dr. Hilarie Fredrickson, pt requested you as her GI MD due to your colon prep preference.  Pt had colonoscopy in 2013 at [facility].  Records will be sent to you for review.  Will you accept this pt?

## 2019-07-16 ENCOUNTER — Encounter: Payer: Self-pay | Admitting: Internal Medicine

## 2019-07-16 DIAGNOSIS — Z6826 Body mass index (BMI) 26.0-26.9, adult: Secondary | ICD-10-CM | POA: Diagnosis not present

## 2019-07-16 DIAGNOSIS — Z01419 Encounter for gynecological examination (general) (routine) without abnormal findings: Secondary | ICD-10-CM | POA: Diagnosis not present

## 2019-07-27 ENCOUNTER — Telehealth: Payer: Self-pay | Admitting: *Deleted

## 2019-07-27 ENCOUNTER — Other Ambulatory Visit: Payer: Self-pay

## 2019-07-27 ENCOUNTER — Ambulatory Visit: Payer: BLUE CROSS/BLUE SHIELD | Admitting: *Deleted

## 2019-07-27 VITALS — Ht 64.0 in | Wt 146.0 lb

## 2019-07-27 DIAGNOSIS — Z800 Family history of malignant neoplasm of digestive organs: Secondary | ICD-10-CM

## 2019-07-27 MED ORDER — ONDANSETRON HCL 4 MG PO TABS: 4.0000 mg | ORAL_TABLET | Freq: Four times a day (QID) | ORAL | 0 refills | Status: DC

## 2019-07-27 MED ORDER — SUPREP BOWEL PREP KIT 17.5-3.13-1.6 GM/177ML PO SOLN: 1.0000 | Freq: Once | ORAL | 0 refills | Status: AC

## 2019-07-27 NOTE — Telephone Encounter (Signed)
Patient informed of zofran rx sent to her pharmacy. Reviewed instructions for prescription.

## 2019-07-27 NOTE — Telephone Encounter (Signed)
Dr Hilarie Fredrickson This patient during [place] today complained of a "weak stomach". Patient stating she is sure that she will not be able to retain the prep as last Colonoscopy at [facility]. She states they gave her 2 enemas on arrival for incomplete prep. Patient is asking for medication to help settle her stomach prior to prep. Please advise Thanks

## 2019-07-27 NOTE — Telephone Encounter (Signed)
Informed the patient that we are waiting foran order from Dr Hilarie Fredrickson. Patient aware that we are working on it.

## 2019-07-27 NOTE — Progress Notes (Unsigned)
No egg or soy allergy known to patient  No issues with past sedation with any surgeries  or procedures, no intubation problems  No diet pills per patient No home 02 use per patient  No blood thinners per patient  Pt denies issues with constipation  No A fib or A flutter  EMMI information,prep instructions, consent for review,acknowledgement form for return with stamped envelope. Patient adament that she may not be able to retain the prep. She reports that she was unable to retain the "jug" last time and she was given 2 enemas prior to her procedure . Patient questioned if there was medication she could take prior like her aunt. Note to Dr Hilarie Fredrickson.

## 2019-07-27 NOTE — Telephone Encounter (Signed)
Ok to call in Zofran 4 mg  q 6hours  Prn - take one an hour before starting prep then q hrs as needed during prep  # 10 /0  please let pt know we are sending it - thanks

## 2019-07-28 NOTE — Telephone Encounter (Signed)
See additional phone note from today, 07/28/19 from Margie Ege, RN.   (see below) Kimber Relic, RN at 07/27/2019 5:19 PM  Status: Signed    Patient informed of zofran rx sent to her pharmacy. Reviewed instructions for prescription.

## 2019-08-13 ENCOUNTER — Telehealth: Payer: Self-pay | Admitting: Internal Medicine

## 2019-08-13 NOTE — Telephone Encounter (Signed)
Spoke with patient regarding insurance gave her all information we have regarding codes we received as of now. Notified patient that I spoke with her insurance and they can only quote me benefits with the code I provided of family hx Z80.0, but that may change when claim is received. Patient verbalized understanding.

## 2019-08-14 ENCOUNTER — Telehealth: Payer: Self-pay | Admitting: Internal Medicine

## 2019-08-14 NOTE — Telephone Encounter (Signed)
Spoke with patient regarding Covid-19 screening questions ° °Do you now or have you had a fever in the last 14 days?    no  °  °Do you have any respiratory symptoms of shortness of breath or cough now or in the last 14 days?    no °  °Do you have any family members or close contacts with diagnosed or suspected Covid-19 in the past 14 days?     no °  °Have you been tested for Covid-19 and found to be positive?    no °  °Please  make patient aware that care partner may wait in the car or come up to the lobby during the procedure but will need to provide their own mask. ° °

## 2019-08-17 ENCOUNTER — Encounter: Payer: Self-pay | Admitting: Internal Medicine

## 2019-08-17 ENCOUNTER — Other Ambulatory Visit: Payer: Self-pay

## 2019-08-17 ENCOUNTER — Ambulatory Visit (AMBULATORY_SURGERY_CENTER): Payer: BC Managed Care – PPO | Admitting: Internal Medicine

## 2019-08-17 VITALS — BP 148/86 | HR 68 | Temp 97.7°F | Resp 15 | Ht 64.0 in | Wt 146.0 lb

## 2019-08-17 DIAGNOSIS — K635 Polyp of colon: Secondary | ICD-10-CM

## 2019-08-17 DIAGNOSIS — Z800 Family history of malignant neoplasm of digestive organs: Secondary | ICD-10-CM | POA: Diagnosis not present

## 2019-08-17 DIAGNOSIS — D124 Benign neoplasm of descending colon: Secondary | ICD-10-CM

## 2019-08-17 DIAGNOSIS — Z1211 Encounter for screening for malignant neoplasm of colon: Secondary | ICD-10-CM | POA: Diagnosis not present

## 2019-08-17 MED ORDER — SODIUM CHLORIDE 0.9 % IV SOLN: 500.0000 mL | Freq: Once | INTRAVENOUS | Status: DC

## 2019-08-17 NOTE — Patient Instructions (Addendum)
YOU HAD AN ENDOSCOPIC PROCEDURE TODAY AT THE [facility]:   Refer to the procedure report that was given to you for any specific questions about what was found during the examination.  If the procedure report does not answer your questions, please call your gastroenterologist to clarify.  If you requested that your care partner not be given the details of your procedure findings, then the procedure report has been included in a sealed envelope for you to review at your convenience later.  YOU SHOULD EXPECT: Some feelings of bloating in the abdomen. Passage of more gas than usual.  Walking can help get rid of the air that was put into your GI tract during the procedure and reduce the bloating. If you had a lower endoscopy (such as a colonoscopy or flexible sigmoidoscopy) you may notice spotting of blood in your stool or on the toilet paper. If you underwent a bowel prep for your procedure, you may not have a normal bowel movement for a few days.  Please Note:  You might notice some irritation and congestion in your nose or some drainage.  This is from the oxygen used during your procedure.  There is no need for concern and it should clear up in a day or so.  SYMPTOMS TO REPORT IMMEDIATELY:   Following lower endoscopy (colonoscopy or flexible sigmoidoscopy):  Excessive amounts of blood in the stool  Significant tenderness or worsening of abdominal pains  Swelling of the abdomen that is new, acute  Fever of 100F or higher   For urgent or emergent issues, a gastroenterologist can be reached at any hour by calling [phone].   DIET:  We do recommend a small meal at first, but then you may proceed to your regular diet.  Drink plenty of fluids but you should avoid alcoholic beverages for 24 hours.  ACTIVITY:  You should plan to take it easy for the rest of today and you should NOT DRIVE or use heavy machinery until tomorrow (because of the sedation medicines used during the test).     FOLLOW UP: Our staff will call the number listed on your records 48-72 hours following your procedure to check on you and address any questions or concerns that you may have regarding the information given to you following your procedure. If we do not reach you, we will leave a message.  We will attempt to reach you two times.  During this call, we will ask if you have developed any symptoms of COVID 19. If you develop any symptoms (ie: fever, flu-like symptoms, shortness of breath, cough etc.) before then, please call [phone].  If you test positive for Covid 19 in the 2 weeks post procedure, please call and report this information to us.    If any biopsies were taken you will be contacted by phone or by letter within the next 1-3 weeks.  Please call us at [phone] if you have not heard about the biopsies in 3 weeks.    SIGNATURES/CONFIDENTIALITY: You and/or your care partner have signed paperwork which will be entered into your electronic medical record.  These signatures attest to the fact that that the information above on your After Visit Summary has been reviewed and is understood.  Full responsibility of the confidentiality of this discharge information lies with you and/or your care-partner.    Handouts were given to you on polyps and diverticulosis. You may resume your current medications today. Await biopsy results. Please call if any questions   or concerns.   

## 2019-08-17 NOTE — Progress Notes (Signed)
No problems noted in the recovery room. maw 

## 2019-08-17 NOTE — Progress Notes (Signed)
Pt. Reports no change in her medical or surgical history since her pre-visit 07/27/2019.

## 2019-08-17 NOTE — Op Note (Signed)
[facility] Patient Name: Beth Mejia Procedure Date: 08/17/2019 8:15 AM MRN: [id number] Endoscopist: Jerene Bears , MD Age: 48 Referring MD:  Date of Birth: 01-May-1971 Gender: Female Account #: [id number] Procedure:                Colonoscopy Indications:              Screening in patient at increased risk: Family                            history of 1st-degree relative with colorectal                            cancer, last colonoscopy 7 years ago Medicines:                Monitored Anesthesia Care Procedure:                Pre-Anesthesia Assessment:                           - Prior to the procedure, a History and Physical                            was performed, and patient medications and                            allergies were reviewed. The patient's tolerance of                            previous anesthesia was also reviewed. The risks                            and benefits of the procedure and the sedation                            options and risks were discussed with the patient.                            All questions were answered, and informed consent                            was obtained. Prior Anticoagulants: The patient has                            taken no previous anticoagulant or antiplatelet                            agents. ASA Grade Assessment: II - A patient with                            mild systemic disease. After reviewing the risks                            and benefits, the patient was deemed in  satisfactory condition to undergo the procedure.                           After obtaining informed consent, the colonoscope                            was passed under direct vision. Throughout the                            procedure, the patient's blood pressure, pulse, and                            oxygen saturations were monitored continuously. The                            Colonoscope was introduced  through the anus and                            advanced to the cecum, identified by appendiceal                            orifice and ileocecal valve. The colonoscopy was                            performed without difficulty. The patient tolerated                            the procedure well. The quality of the bowel                            preparation was good. The ileocecal valve,                            appendiceal orifice, and rectum were photographed. Scope In: 8:22:17 AM Scope Out: 8:37:56 AM Scope Withdrawal Time: 0 hours 11 minutes 17 seconds  Total Procedure Duration: 0 hours 15 minutes 39 seconds  Findings:                 The digital rectal exam was normal.                           A 5 mm polyp was found in the descending colon. The                            polyp was sessile. The polyp was removed with a                            cold snare. Resection and retrieval were complete.                           Multiple small-mouthed diverticula were found in                            the sigmoid colon and descending colon.  The retroflexed view of the distal rectum and anal                            verge was normal and showed no anal or rectal                            abnormalities. Complications:            No immediate complications. Estimated Blood Loss:     Estimated blood loss was minimal. Impression:               - One 5 mm polyp in the descending colon, removed                            with a cold snare. Resected and retrieved.                           - Diverticulosis in the sigmoid colon and in the                            descending colon.                           - The distal rectum and anal verge are normal on                            retroflexion view. Recommendation:           - Patient has a contact number available for                            emergencies. The signs and symptoms of potential                             delayed complications were discussed with the                            patient. Return to normal activities tomorrow.                            Written discharge instructions were provided to the                            patient.                           - Resume previous diet.                           - Continue present medications.                           - Await pathology results.                           - Repeat colonoscopy is recommended for  surveillance and family history of colon cancer.                            The colonoscopy date will be determined after                            pathology results from today's exam become                            available for review. Jerene Bears, MD 08/17/2019 8:43:01 AM This report has been signed electronically.

## 2019-08-17 NOTE — Progress Notes (Signed)
Called to room to assist during endoscopic procedure.  Patient ID and intended procedure confirmed with present staff. Received instructions for my participation in the procedure from the performing physician.  

## 2019-08-17 NOTE — Progress Notes (Signed)
Report to PACU, RN, vss, BBS= Clear.  

## 2019-08-19 ENCOUNTER — Telehealth: Payer: Self-pay

## 2019-08-19 NOTE — Telephone Encounter (Signed)
  Follow up Call-  Call back number 08/17/2019  Post procedure Call Back phone  # [phone]  Permission to leave phone message Yes  Some recent data might be hidden     Patient questions:  Do you have a fever, pain , or abdominal swelling? No. Pain Score  0 *  Have you tolerated food without any problems? Yes.    Have you been able to return to your normal activities? Yes.    Do you have any questions about your discharge instructions: Diet   No. Medications  No. Follow up visit  No.  Do you have questions or concerns about your Care? No.  Actions: * If pain score is 4 or above: 1. No action needed, pain <4.Have you developed a fever since your procedure? no  2.   Have you had an respiratory symptoms (SOB or cough) since your procedure? no  3.   Have you tested positive for COVID 19 since your procedure no  4.   Have you had any family members/close contacts diagnosed with the COVID 19 since your procedure?  no   If yes to any of these questions please route to Joylene John, RN and Alphonsa Gin, Therapist, sports.

## 2019-08-27 ENCOUNTER — Encounter: Payer: Self-pay | Admitting: Internal Medicine

## 2019-10-04 DIAGNOSIS — N760 Acute vaginitis: Secondary | ICD-10-CM | POA: Diagnosis not present

## 2019-10-21 DIAGNOSIS — N898 Other specified noninflammatory disorders of vagina: Secondary | ICD-10-CM | POA: Diagnosis not present

## 2019-10-21 DIAGNOSIS — Z6826 Body mass index (BMI) 26.0-26.9, adult: Secondary | ICD-10-CM | POA: Diagnosis not present

## 2019-11-04 DIAGNOSIS — N760 Acute vaginitis: Secondary | ICD-10-CM | POA: Diagnosis not present

## 2019-11-04 DIAGNOSIS — Z6825 Body mass index (BMI) 25.0-25.9, adult: Secondary | ICD-10-CM | POA: Diagnosis not present

## 2019-12-02 DIAGNOSIS — J40 Bronchitis, not specified as acute or chronic: Secondary | ICD-10-CM | POA: Diagnosis not present

## 2020-03-28 DIAGNOSIS — Z1231 Encounter for screening mammogram for malignant neoplasm of breast: Secondary | ICD-10-CM | POA: Diagnosis not present

## 2020-03-29 ENCOUNTER — Other Ambulatory Visit: Payer: Self-pay | Admitting: Obstetrics and Gynecology

## 2020-03-29 DIAGNOSIS — R928 Other abnormal and inconclusive findings on diagnostic imaging of breast: Secondary | ICD-10-CM

## 2020-04-11 ENCOUNTER — Ambulatory Visit
Admission: RE | Admit: 2020-04-11 | Discharge: 2020-04-11 | Disposition: A | Discharge status: Home / Self Care | Payer: BC Managed Care – PPO | Source: Ambulatory Visit | Attending: Obstetrics and Gynecology | Admitting: Obstetrics and Gynecology

## 2020-04-11 ENCOUNTER — Other Ambulatory Visit: Payer: Self-pay

## 2020-04-11 DIAGNOSIS — N6489 Other specified disorders of breast: Secondary | ICD-10-CM | POA: Diagnosis not present

## 2020-04-11 DIAGNOSIS — R928 Other abnormal and inconclusive findings on diagnostic imaging of breast: Secondary | ICD-10-CM

## 2020-04-11 DIAGNOSIS — R922 Inconclusive mammogram: Secondary | ICD-10-CM | POA: Diagnosis not present

## 2021-09-08 ENCOUNTER — Other Ambulatory Visit: Payer: Self-pay | Admitting: Urology

## 2021-09-08 DIAGNOSIS — N200 Calculus of kidney: Secondary | ICD-10-CM

## 2021-09-22 NOTE — Progress Notes (Signed)
Pt returned call. Hx and meds reviewed. Arrival time 0645 cl liquids 0445.husband is the driver. Encouraged to wear mask over the weekend whenout

## 2021-09-22 NOTE — Progress Notes (Signed)
Left message for patient to return call.

## 2021-09-24 NOTE — H&P (Signed)
H&P  Chief Complaint: Lt renal stone  History of Present Illness: 50 yo female presents for ESL of a 4 mm Lt renal stone.  Past Medical History:  Diagnosis Date   Allergy    Anemia    Ectopic pregnancy 2004   History of kidney stones    Kidney stone     Past Surgical History:  Procedure Laterality Date   CESAREAN SECTION     COLONOSCOPY     ECTOPIC PREGNANCY SURGERY     EXTRACORPOREAL SHOCK WAVE LITHOTRIPSY Right 05/20/2017   Procedure: RIGHT EXTRACORPOREAL SHOCK WAVE LITHOTRIPSY (ESWL);  Surgeon: Alexis Frock, MD;  Location: [facility];  Service: Urology;  Laterality: Right;   LITHOTRIPSY     WRIST SURGERY     cyst removed per right wrist     Home Medications:  Allergies as of 09/24/2021       Reactions   Aspirin Rash        Medication List      Notice   Cannot display discharge medications because the patient has not yet been admitted.     Allergies:  Allergies  Allergen Reactions   Aspirin Rash    Family History  Problem Relation Age of Onset   Colon cancer Father    Esophageal cancer Maternal Grandmother    Stomach cancer Neg Hx    Rectal cancer Neg Hx     Social History:  reports that she has never smoked. She has never used smokeless tobacco. She reports that she does not drink alcohol and does not use drugs.  ROS: A complete review of systems was performed.  All systems are negative except for pertinent findings as noted.  Physical Exam:  Vital signs in last 24 hours: There were no vitals taken for this visit. Constitutional:  Alert and oriented, No acute distress Cardiovascular: Regular rate  Respiratory: Normal respiratory effort GI: Abdomen is soft, nontender, nondistended, no abdominal masses. No CVAT.  Genitourinary: Normal female phallus, testes are descended bilaterally and non-tender and without masses, scrotum is normal in appearance without lesions or masses, perineum is normal on inspection. Lymphatic: No lymphadenopathy Neurologic:  Grossly intact, no focal deficits Psychiatric: Normal mood and affect  I have reviewed prior pt notes  I have independently reviewed prior imaging    Impression/Assessment:  Lt renal stone  Plan:  ESL

## 2021-09-25 ENCOUNTER — Ambulatory Visit (HOSPITAL_BASED_OUTPATIENT_CLINIC_OR_DEPARTMENT_OTHER)
Admission: RE | Admit: 2021-09-25 | Discharge: 2021-09-25 | Disposition: A | Discharge status: Home / Self Care | Payer: BC Managed Care – PPO | Attending: Urology | Admitting: Urology

## 2021-09-25 ENCOUNTER — Ambulatory Visit (HOSPITAL_COMMUNITY): Payer: BC Managed Care – PPO

## 2021-09-25 ENCOUNTER — Other Ambulatory Visit: Payer: Self-pay

## 2021-09-25 ENCOUNTER — Encounter (HOSPITAL_BASED_OUTPATIENT_CLINIC_OR_DEPARTMENT_OTHER)
Admission: RE | Disposition: A | Discharge status: Home / Self Care | Payer: Self-pay | Source: Home / Self Care | Attending: Urology

## 2021-09-25 ENCOUNTER — Encounter (HOSPITAL_BASED_OUTPATIENT_CLINIC_OR_DEPARTMENT_OTHER): Payer: Self-pay | Admitting: Urology

## 2021-09-25 DIAGNOSIS — Z87442 Personal history of urinary calculi: Secondary | ICD-10-CM | POA: Diagnosis not present

## 2021-09-25 DIAGNOSIS — N200 Calculus of kidney: Secondary | ICD-10-CM | POA: Diagnosis not present

## 2021-09-25 DIAGNOSIS — Z886 Allergy status to analgesic agent status: Secondary | ICD-10-CM | POA: Diagnosis not present

## 2021-09-25 HISTORY — PX: EXTRACORPOREAL SHOCK WAVE LITHOTRIPSY: SHX1557

## 2021-09-25 LAB — POCT PREGNANCY, URINE: Preg Test, Ur: NEGATIVE

## 2021-09-25 SURGERY — LITHOTRIPSY, ESWL
Anesthesia: LOCAL | Laterality: Left

## 2021-09-25 MED ORDER — DIAZEPAM 5 MG PO TABS
10.0000 mg | ORAL_TABLET | ORAL | Status: AC
  Administered 2021-09-25: 10 mg via ORAL

## 2021-09-25 MED ORDER — DIAZEPAM 5 MG PO TABS
ORAL_TABLET | ORAL | Status: AC
  Filled 2021-09-25: qty 2

## 2021-09-25 MED ORDER — DIPHENHYDRAMINE HCL 25 MG PO CAPS
ORAL_CAPSULE | ORAL | Status: AC
  Filled 2021-09-25: qty 1

## 2021-09-25 MED ORDER — DIPHENHYDRAMINE HCL 25 MG PO CAPS
25.0000 mg | ORAL_CAPSULE | ORAL | Status: AC
  Administered 2021-09-25: 25 mg via ORAL

## 2021-09-25 MED ORDER — CIPROFLOXACIN HCL 500 MG PO TABS
ORAL_TABLET | ORAL | Status: AC
  Filled 2021-09-25: qty 1

## 2021-09-25 MED ORDER — CIPROFLOXACIN HCL 500 MG PO TABS
500.0000 mg | ORAL_TABLET | ORAL | Status: AC
  Administered 2021-09-25: 500 mg via ORAL

## 2021-09-25 MED ORDER — SODIUM CHLORIDE 0.9 % IV SOLN: INTRAVENOUS | Status: DC

## 2021-09-25 NOTE — Interval H&P Note (Signed)
History and Physical Interval Note:  09/25/2021 9:34 AM  Beth Mejia  has presented today for surgery, with the diagnosis of LEFT RENAL STONE.  The various methods of treatment have been discussed with the patient and family. After consideration of risks, benefits and other options for treatment, the patient has consented to  Procedure(s): LEFT EXTRACORPOREAL SHOCK WAVE LITHOTRIPSY (ESWL) (Left) as a surgical intervention.  The patient's history has been reviewed, patient examined, no change in status, stable for surgery.  I have reviewed the patient's chart and labs.  Questions were answered to the patient's satisfaction.     Lillette Boxer Rian Koon

## 2021-09-25 NOTE — Discharge Instructions (Signed)
See [facility] discharge instructions in chart.  

## 2021-09-25 NOTE — Op Note (Signed)
See [facility] OP note scanned into chart. 

## 2021-09-26 ENCOUNTER — Encounter (HOSPITAL_BASED_OUTPATIENT_CLINIC_OR_DEPARTMENT_OTHER): Payer: Self-pay | Admitting: Urology

## 2024-06-11 ENCOUNTER — Encounter: Payer: Self-pay | Admitting: Internal Medicine

## 2024-07-29 ENCOUNTER — Ambulatory Visit (AMBULATORY_SURGERY_CENTER): Admitting: *Deleted

## 2024-07-29 ENCOUNTER — Encounter: Payer: Self-pay | Admitting: Internal Medicine

## 2024-07-29 VITALS — Ht 63.0 in | Wt 148.0 lb

## 2024-07-29 DIAGNOSIS — Z860100 Personal history of colon polyps, unspecified: Secondary | ICD-10-CM

## 2024-07-29 DIAGNOSIS — Z800 Family history of malignant neoplasm of digestive organs: Secondary | ICD-10-CM

## 2024-07-29 MED ORDER — NA SULFATE-K SULFATE-MG SULF 17.5-3.13-1.6 GM/177ML PO SOLN
1.0000 | Freq: Once | ORAL | 0 refills | Status: AC
Start: 2024-07-29 — End: 2024-07-29

## 2024-07-29 NOTE — Progress Notes (Signed)
 Pt's name and DOB verified at the beginning of the pre-visit with 2 identifiers  Pt denies any difficulty with ambulating,sitting, laying down or rolling side to side   Pt uses ambulation assistance device or has issues with mobiiity  Pt has no issues moving head neck or swallowing  No egg or soy allergy known to patient   No issues known to pt with past sedation with any surgeries or procedures  No FH of Malignant Hyperthermia  Pt is not on home 02   Pt is not on blood thinners    Pt has frequent issues with constipation RN instructed pt to use Miralax per bottles instructions a week before prep days. Pt states they will  Pt is not on dialysis  Pt denise any abnormal heart rhythms   Pt denies any upcoming cardiac testing  Patient's chart reviewed by Norleen Schillings CNRA prior to pre-visit and patient appropriate for the [facility].  Pre-visit completed and red dot placed by patient's name on their procedure day (on provider's schedule).    Visit by phone  Pt states weight is 148 lb   Instructions reviewed. Pt given  both [facility] main # and MD on call # prior to instructions.   Informed pt to come in at the time discussed and is shown on PV instructions. Pt informed that they are coming in i.e. cloths change.,IV placement , Consent signing and meeting CRNA. Pt states understanding after  given opportunity to ask questions t after all  instructions given.  Instructed pt to review instructions again prior to procedure and call main # given if has any questions.. Pt states they will.  Instructed pt where to find PV instructions in My Chart

## 2024-08-19 ENCOUNTER — Encounter: Payer: Self-pay | Admitting: Internal Medicine

## 2024-08-19 ENCOUNTER — Ambulatory Visit: Admitting: Internal Medicine

## 2024-08-19 VITALS — BP 130/85 | HR 70 | Temp 97.5°F | Resp 17 | Ht 63.0 in | Wt 145.0 lb

## 2024-08-19 DIAGNOSIS — Z1211 Encounter for screening for malignant neoplasm of colon: Secondary | ICD-10-CM

## 2024-08-19 DIAGNOSIS — Z800 Family history of malignant neoplasm of digestive organs: Secondary | ICD-10-CM

## 2024-08-19 DIAGNOSIS — K5730 Diverticulosis of large intestine without perforation or abscess without bleeding: Secondary | ICD-10-CM

## 2024-08-19 DIAGNOSIS — D120 Benign neoplasm of cecum: Secondary | ICD-10-CM | POA: Diagnosis not present

## 2024-08-19 DIAGNOSIS — K635 Polyp of colon: Secondary | ICD-10-CM | POA: Diagnosis not present

## 2024-08-19 MED ORDER — SODIUM CHLORIDE 0.9 % IV SOLN
500.0000 mL | Freq: Once | INTRAVENOUS | Status: DC
Start: 2024-08-19 — End: 2024-08-19

## 2024-08-19 NOTE — Progress Notes (Signed)
 GASTROENTEROLOGY PROCEDURE H&P NOTE   Primary Care Physician: Ina Marcellus RAMAN, MD    Reason for Procedure:  Family history of colon cancer  Plan:    Colonoscopy  Patient is appropriate for endoscopic procedure(s) in the ambulatory ([facility]) setting.  The nature of the procedure, as well as the risks, benefits, and alternatives were carefully and thoroughly reviewed with the patient. Ample time for discussion and questions allowed. The patient understood, was satisfied, and agreed to proceed.     HPI: Beth Mejia is a 53 y.o. female who presents for elevated risk screening colonoscopy.  Medical history as below.  Tolerated the prep.  No recent chest pain or shortness of breath.  No abdominal pain today.  Past Medical History:  Diagnosis Date   Allergy    Anemia    Ectopic pregnancy 2004   Heart murmur    History of kidney stones    Hypertension    Kidney stone     Past Surgical History:  Procedure Laterality Date   CESAREAN SECTION     COLONOSCOPY     ECTOPIC PREGNANCY SURGERY     EXTRACORPOREAL SHOCK WAVE LITHOTRIPSY Right 05/20/2017   Procedure: RIGHT EXTRACORPOREAL SHOCK WAVE LITHOTRIPSY (ESWL);  Surgeon: Alvaro Hummer, MD;  Location: [facility];  Service: Urology;  Laterality: Right;   EXTRACORPOREAL SHOCK WAVE LITHOTRIPSY Left 09/25/2021   Procedure: LEFT EXTRACORPOREAL SHOCK WAVE LITHOTRIPSY (ESWL);  Surgeon: Matilda Senior, MD;  Location: [facility];  Service: Urology;  Laterality: Left;   LITHOTRIPSY     WRIST SURGERY     cyst removed per right wrist     Prior to Admission medications   Medication Sig Start Date End Date Taking? Authorizing Provider  cholecalciferol (VITAMIN D) 1000 units tablet Take 1,000 Units by mouth daily. Patient taking differently: Take 1,000 Units by mouth daily. Takes 2000   Yes [provider]  escitalopram (LEXAPRO) 10 MG tablet Take 10 mg by mouth daily. 01/03/23  Yes [provider]  FYAVOLV  1-5 MG-MCG TABS tablet Take 1 tablet by mouth daily.   Yes [provider]  hydrochlorothiazide (HYDRODIURIL) 12.5 MG tablet Take 12.5 mg by mouth every morning. 12/23/22  Yes [provider]  Levocetirizine Dihydrochloride (XYZAL PO)    Yes [provider]  albuterol (VENTOLIN HFA) 108 (90 Base) MCG/ACT inhaler INHALE 2 PUFFS BY MOUTH EVERY 4 HOURS Patient taking differently: as needed. 02/23/19   [provider]  Azelastine HCl 137 MCG/SPRAY SOLN Place 2 sprays into the nose as needed. 02/19/23   [provider]  cetirizine (ZYRTEC) 10 MG chewable tablet Chew 10 mg by mouth daily. Patient not taking: Reported on 08/19/2024    [provider]  fluticasone (FLONASE) 50 MCG/ACT nasal spray Place 2 sprays into both nostrils daily. Patient taking differently: Place 2 sprays into both nostrils as needed.    [provider]  OVER THE COUNTER MEDICATION as needed. Belly welly for constipation    [provider]  UNABLE TO FIND Compounded testorone cream Patient not taking: Reported on 08/19/2024    [provider]    Current Outpatient Medications  Medication Sig Dispense Refill   cholecalciferol (VITAMIN D) 1000 units tablet Take 1,000 Units by mouth daily. (Patient taking differently: Take 1,000 Units by mouth daily. Takes 2000)     escitalopram (LEXAPRO) 10 MG tablet Take 10 mg by mouth daily.     FYAVOLV 1-5 MG-MCG TABS tablet Take 1 tablet by mouth  daily.     hydrochlorothiazide (HYDRODIURIL) 12.5 MG tablet Take 12.5 mg by mouth every morning.     Levocetirizine Dihydrochloride (XYZAL PO)      albuterol (VENTOLIN HFA) 108 (90 Base) MCG/ACT inhaler INHALE 2 PUFFS BY MOUTH EVERY 4 HOURS (Patient taking differently: as needed.)     Azelastine HCl 137 MCG/SPRAY SOLN Place 2 sprays into the nose as needed.     cetirizine (ZYRTEC) 10 MG chewable tablet Chew 10 mg by mouth daily. (Patient not taking: Reported on 08/19/2024)      fluticasone (FLONASE) 50 MCG/ACT nasal spray Place 2 sprays into both nostrils daily. (Patient taking differently: Place 2 sprays into both nostrils as needed.)     OVER THE COUNTER MEDICATION as needed. Belly welly for constipation     UNABLE TO FIND Compounded testorone cream (Patient not taking: Reported on 08/19/2024)     Current Facility-Administered Medications  Medication Dose Route Frequency Provider Last Rate Last Admin   0.9 %  sodium chloride  infusion  500 mL Intravenous Once Aidin Doane, Gordy HERO, MD        Allergies as of 08/19/2024 - Review Complete 08/19/2024  Allergen Reaction Noted   Aspirin Rash 05/17/2017    Family History  Problem Relation Age of Onset   Colon cancer Father    Esophageal cancer Maternal Grandmother    Stomach cancer Neg Hx    Rectal cancer Neg Hx    Colon polyps Neg Hx     Social History   Socioeconomic History   Marital status: Married    Spouse name: Not on file   Number of children: Not on file   Years of education: Not on file   Highest education level: Not on file  Occupational History   Not on file  Tobacco Use   Smoking status: Never   Smokeless tobacco: Never  Vaping Use   Vaping status: Never Used  Substance and Sexual Activity   Alcohol use: No   Drug use: No   Sexual activity: Not on file  Other Topics Concern   Not on file  Social History Narrative   Not on file   Social Drivers of Health   Financial Resource Strain: Not on file  Food Insecurity: Not on file  Transportation Needs: Not on file  Physical Activity: Not on file  Stress: Not on file  Social Connections: Not on file  Intimate Partner Violence: Not on file    Physical Exam: Vital signs in last 24 hours: @BP  (!) 145/80   Pulse 68   Temp (!) 97.5 F (36.4 C)   Ht 5' 3 (1.6 m)   Wt 145 lb (65.8 kg)   SpO2 98%   BMI 25.69 kg/m  GEN: NAD EYE: Sclerae anicteric ENT: MMM CV: Non-tachycardic Pulm: CTA b/l GI: Soft, NT/ND NEURO:  Alert & Oriented x  3   Gordy Starch, MD [facility]  08/19/2024 8:47 AM

## 2024-08-19 NOTE — Op Note (Signed)
 [name] Procedure Date: 08/19/2024 8:46 AM MRN: [id number] Endoscopist: Gordy CHRISTELLA Starch , MD, [id number] Age: 53 Referring MD:  Date of Birth: [birth year] Gender: Female Account #: [id number] Procedure:                Colonoscopy Indications:              Screening in patient at increased risk: Family                            history of 1st-degree relative with colorectal                            cancer, Last colonoscopy: August 2020 Medicines:                Monitored Anesthesia Care Procedure:                Pre-Anesthesia Assessment:                           - Prior to the procedure, a History and Physical                            was performed, and patient medications and                            allergies were reviewed. The patient's tolerance of                            previous anesthesia was also reviewed. The risks                            and benefits of the procedure and the sedation                            options and risks were discussed with the patient.                            All questions were answered, and informed consent                            was obtained. Prior Anticoagulants: The patient has                            taken no anticoagulant or antiplatelet agents. ASA                            Grade Assessment: II - A patient with mild systemic                            disease. After reviewing the risks and benefits,                            the patient was deemed in satisfactory condition to  undergo the procedure.                           After obtaining informed consent, the colonoscope                            was passed under direct vision. Throughout the                            procedure, the patient's blood pressure, pulse, and                            oxygen saturations were monitored continuously. The                            [id number]-HQ190L Colonoscope  [id number] was introduced                            through the anus and advanced to the cecum,                            identified by appendiceal orifice and ileocecal                            valve. The colonoscopy was performed without                            difficulty. The patient tolerated the procedure                            well. The quality of the bowel preparation was                            good. The ileocecal valve, appendiceal orifice, and                            rectum were photographed. Scope In: 9:00:00 AM Scope Out: 9:13:26 AM Scope Withdrawal Time: 0 hours 8 minutes 53 seconds  Total Procedure Duration: 0 hours 13 minutes 26 seconds  Findings:                 The digital rectal exam was normal.                           A 5 mm polyp was found in the cecum. The polyp was                            sessile. The polyp was removed with a cold snare.                            Resection and retrieval were complete.                           Multiple small-mouthed diverticula were found in  the sigmoid colon, distal descending colon, hepatic                            flexure and ascending colon.                           The entire examined colon appeared normal. Complications:            No immediate complications. Estimated Blood Loss:     Estimated blood loss: none. Impression:               - One 5 mm polyp in the cecum, removed with a cold                            snare. Resected and retrieved.                           - Mild diverticulosis in the sigmoid colon, in the                            distal descending colon, at the hepatic flexure and                            in the ascending colon.                           - The entire examined colon is normal. Recommendation:           - Patient has a contact number available for                            emergencies. The signs and symptoms of potential                             delayed complications were discussed with the                            patient. Return to normal activities tomorrow.                            Written discharge instructions were provided to the                            patient.                           - Resume previous diet.                           - Continue present medications.                           - Await pathology results.                           - Repeat colonoscopy in 5 years. Gordy CHRISTELLA Starch, MD 08/19/2024 9:16:41 AM This  report has been signed electronically.

## 2024-08-19 NOTE — Progress Notes (Signed)
 Called to room to assist during endoscopic procedure.  Patient ID and intended procedure confirmed with present staff. Received instructions for my participation in the procedure from the performing physician.

## 2024-08-19 NOTE — Progress Notes (Signed)
 Report to PACU, RN, vss, BBS= Clear.

## 2024-08-19 NOTE — Progress Notes (Signed)
 Pt's states no medical or surgical changes since previsit or office visit.

## 2024-08-19 NOTE — Patient Instructions (Addendum)
-   1 polyp removed and sent to pathology, diverticulosis  - Resume previous diet.  - Continue present medications.  - Await pathology results.  - Repeat colonoscopy in 5 years.  YOU HAD AN ENDOSCOPIC PROCEDURE TODAY AT THE [facility]:   Refer to the procedure report that was given to you for any specific questions about what was found during the examination.  If the procedure report does not answer your questions, please call your gastroenterologist to clarify.  If you requested that your care partner not be given the details of your procedure findings, then the procedure report has been included in a sealed envelope for you to review at your convenience later.  YOU SHOULD EXPECT: Some feelings of bloating in the abdomen. Passage of more gas than usual.  Walking can help get rid of the air that was put into your GI tract during the procedure and reduce the bloating. If you had a lower endoscopy (such as a colonoscopy or flexible sigmoidoscopy) you may notice spotting of blood in your stool or on the toilet paper. If you underwent a bowel prep for your procedure, you may not have a normal bowel movement for a few days.  Please Note:  You might notice some irritation and congestion in your nose or some drainage.  This is from the oxygen used during your procedure.  There is no need for concern and it should clear up in a day or so.  SYMPTOMS TO REPORT IMMEDIATELY:  Following lower endoscopy (colonoscopy or flexible sigmoidoscopy):  Excessive amounts of blood in the stool  Significant tenderness or worsening of abdominal pains  Swelling of the abdomen that is new, acute  Fever of 100F or higher   For urgent or emergent issues, a gastroenterologist can be reached at any hour by calling [phone]. Do not use MyChart messaging for urgent concerns.    DIET:  We do recommend a small meal at first, but then you may proceed to your regular diet.  Drink plenty of fluids but you  should avoid alcoholic beverages for 24 hours.  ACTIVITY:  You should plan to take it easy for the rest of today and you should NOT DRIVE or use heavy machinery until tomorrow (because of the sedation medicines used during the test).    FOLLOW UP: Our staff will call the number listed on your records the next business day following your procedure.  We will call around 7:15- 8:00 am to check on you and address any questions or concerns that you may have regarding the information given to you following your procedure. If we do not reach you, we will leave a message.     If any biopsies were taken you will be contacted by phone or by letter within the next 1-3 weeks.  Please call us  at [phone] if you have not heard about the biopsies in 3 weeks.    SIGNATURES/CONFIDENTIALITY: You and/or your care partner have signed paperwork which will be entered into your electronic medical record.  These signatures attest to the fact that that the information above on your After Visit Summary has been reviewed and is understood.  Full responsibility of the confidentiality of this discharge information lies with you and/or your care-partner.

## 2024-08-20 ENCOUNTER — Telehealth: Payer: Self-pay | Admitting: *Deleted

## 2024-08-20 NOTE — Telephone Encounter (Signed)
  Follow up Call-     08/19/2024    7:59 AM  Call back number  Post procedure Call Back phone  # [phone]  Permission to leave phone message Yes     Patient questions:  Do you have a fever, pain , or abdominal swelling? No. Pain Score  0 *  Have you tolerated food without any problems? Yes.    Have you been able to return to your normal activities? Yes.    Do you have any questions about your discharge instructions: Diet   No. Medications  No. Follow up visit  No.  Do you have questions or concerns about your Care? No.  Actions: * If pain score is 4 or above: No action needed, pain <4.

## 2024-08-21 LAB — SURGICAL PATHOLOGY

## 2024-08-22 ENCOUNTER — Ambulatory Visit: Payer: Self-pay | Admitting: Internal Medicine
